# Patient Record
Sex: Male | Born: 1995 | Race: White | Hispanic: No | Marital: Single | Smoking: Current every day smoker
Health system: Southern US, Community
[De-identification: ages and names within clinical notes are randomized; demographics above are authoritative.]

---
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
## 2015-10-17 IMAGING — CT CT ABD-PELV W/ CM
2 of 4 series · 16 of 46 positions shown, 18 images · IV contrast (APPLIED)
Comparison: None.

CLINICAL DATA: Vomiting, diarrhea, lower abdominal pain starting
this morning

EXAM:
CT ABDOMEN AND PELVIS WITH CONTRAST
TECHNIQUE: Multidetector CT imaging of the abdomen and pelvis was performed
using the standard protocol following bolus administration of
intravenous contrast.
CONTRAST:  50mL OMNIPAQUE IOHEXOL 300 MG/ML SOLN, 100mL OMNIPAQUE
IOHEXOL 300 MG/ML SOLN

[Series 2: abd/pelvis 5.0 b31f · axial · 0.59mm/px · z∈[+695,+1095]mm · 13 of 88 slices shown, 15 images]
[im 4/88  soft-tissue]
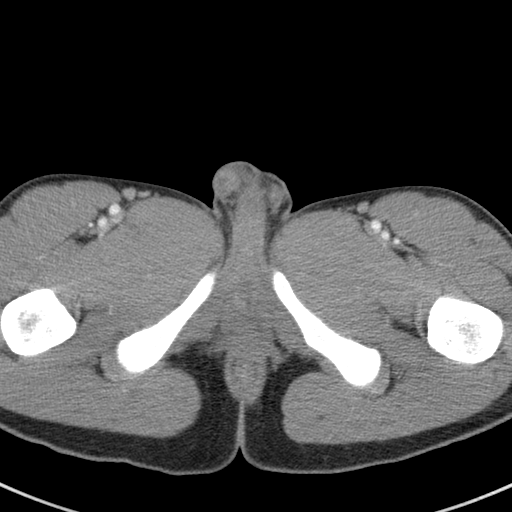
[im 4/88  bone]
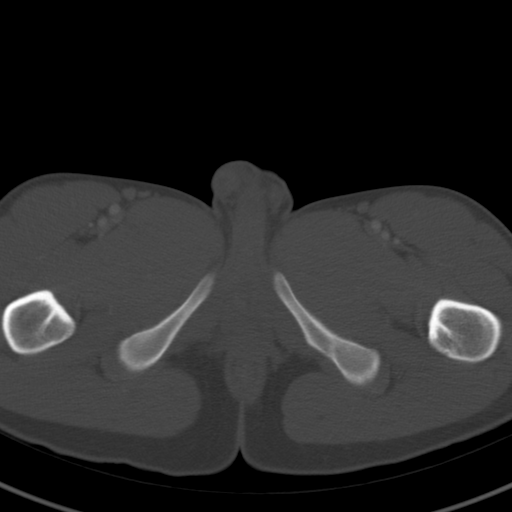
[im 11/88  soft-tissue]
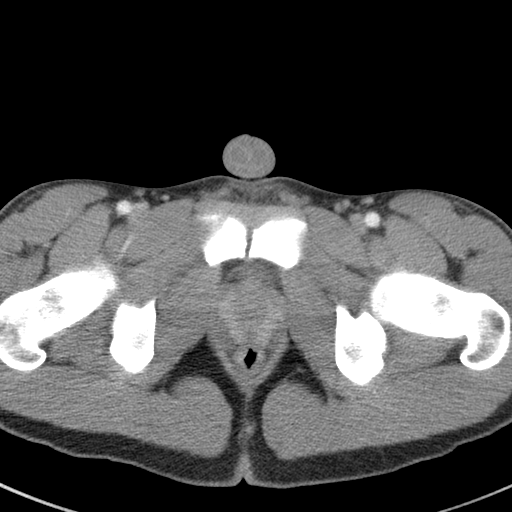
[im 18/88  soft-tissue]
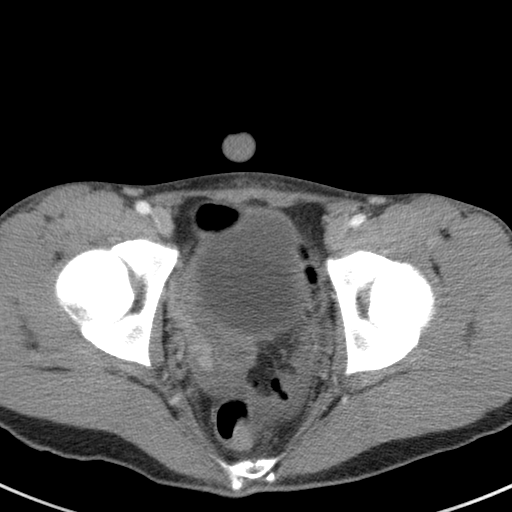
[im 25/88  soft-tissue]
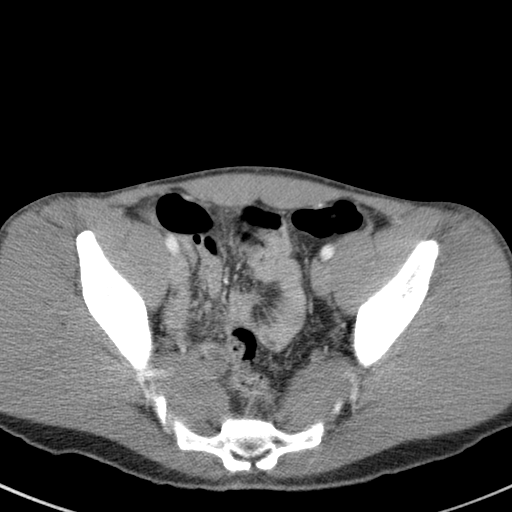
[im 32/88  soft-tissue]
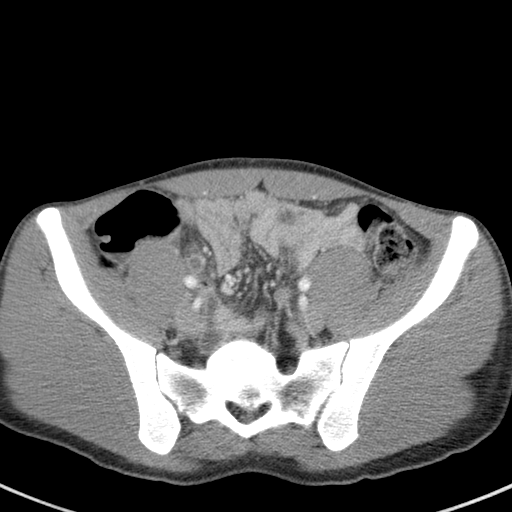
[im 39/88  soft-tissue]
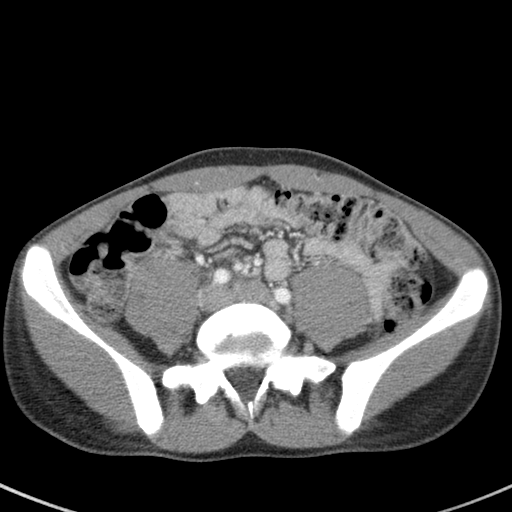
[im 46/88  soft-tissue]
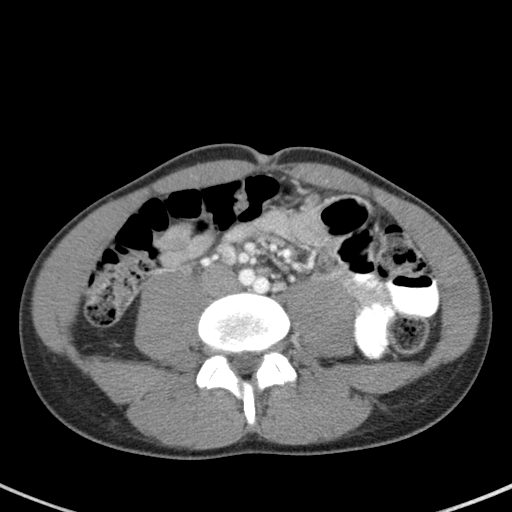
[im 49/88  soft-tissue]
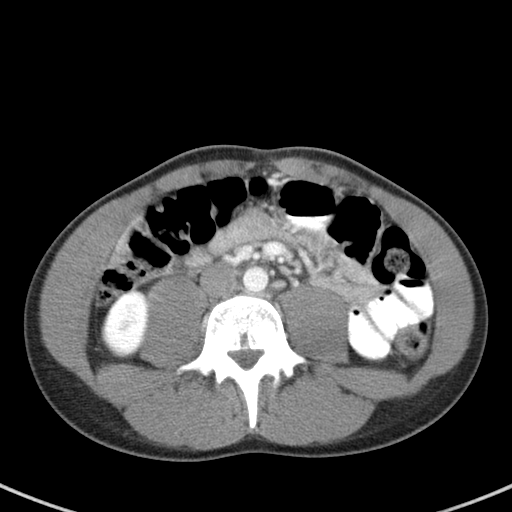
[im 56/88  soft-tissue]
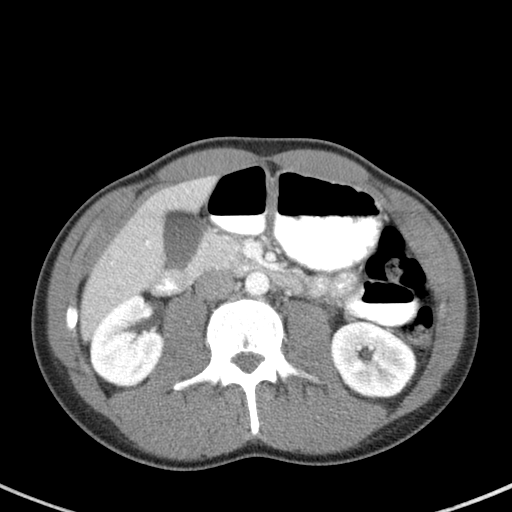
[im 56/88  bone]
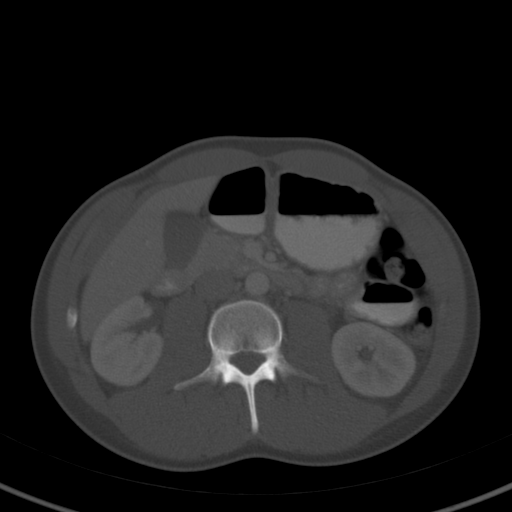
[im 63/88  soft-tissue]
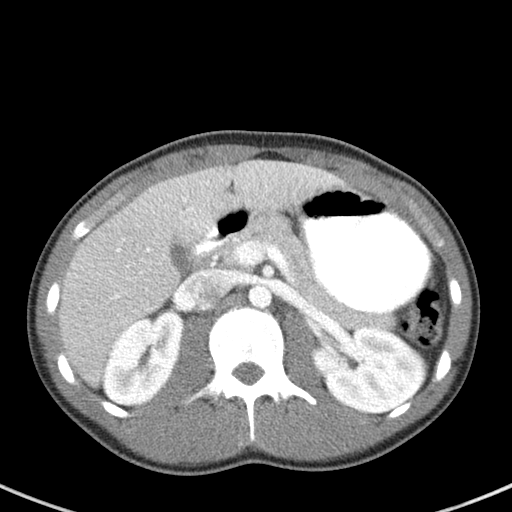
[im 70/88  soft-tissue]
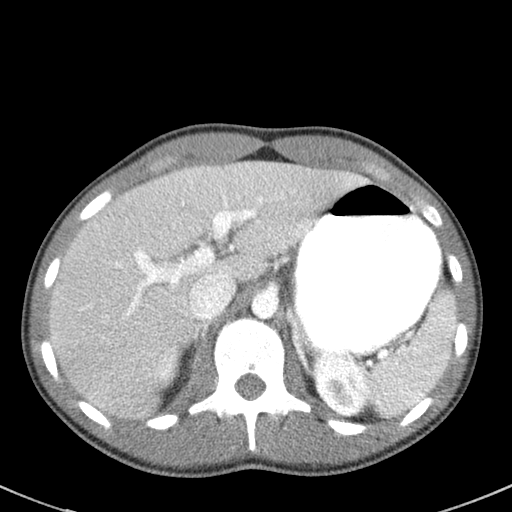
[im 77/88  soft-tissue]
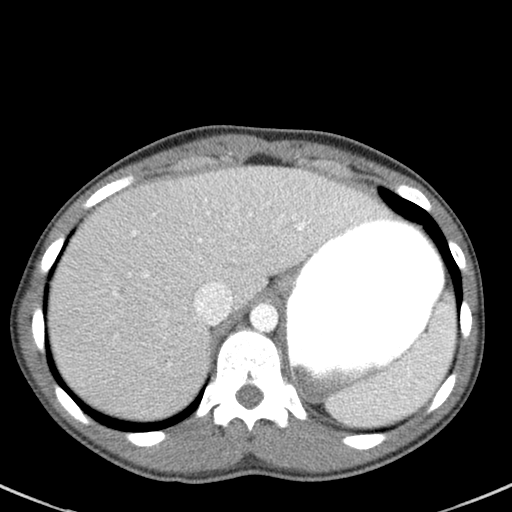
[im 84/88  soft-tissue]
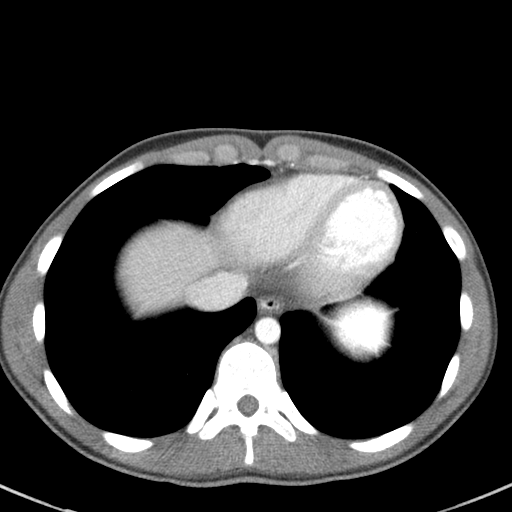

[Series 5: abd/pelvis 3.0 coronal · coronal · 0.65mm/px · 3 of 72 slices shown]
[im 24/72  soft-tissue]
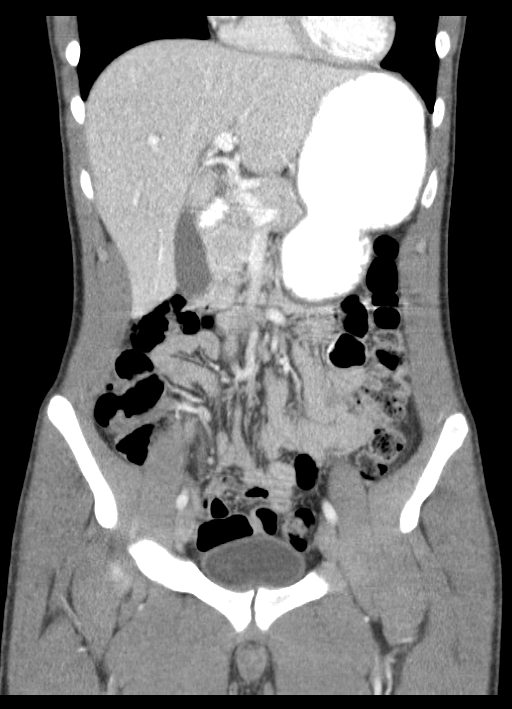
[im 32/72  soft-tissue]
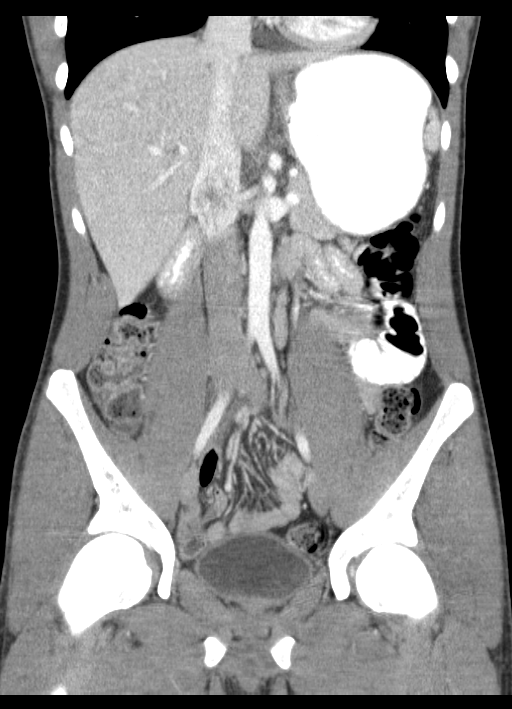
[im 40/72  soft-tissue]
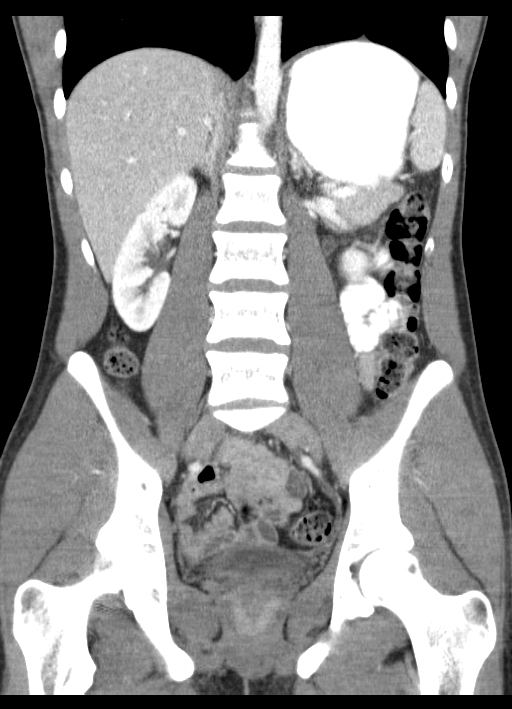

[16 of 46 positions shown; findings below may reference images not displayed]

FINDINGS: Lung bases are unremarkable.

Sagittal images of the spine are unremarkable.

Liver, spleen, pancreas and adrenals are unremarkable. No calcified
gallstones are noted within gallbladder. Abdominal aorta is
unremarkable. Kidneys are symmetrical in size and enhancement. No
hydronephrosis or hydroureter. Moderate colonic stool. There is
abnormal enhancement and thickening of the tip of the appendix.
There is mild stranding of surrounding fat. The appendix is best
visualized in axial image 59 measures 1.2 cm in diameter. Findings
are consistent with early acute appendicitis. Trace free fluid noted
posterior pelvis. The urinary bladder is unremarkable. Prostate
gland and seminal vesicles are unremarkable. No destructive bony
lesions are noted within pelvis. Abnormal tip of the appendix is
confirmed on coronal image 33.

No small bowel obstruction.  No free abdominal air.
IMPRESSION: 1. There is abnormal enhancement and thickening of the tip of the
appendix with stranding of surrounding fat. The appendix measures
1.2 cm in diameter. Findings are consistent with early tip
appendicitis.
2. No small bowel obstruction. Small amount of free fluid noted
posterior or pelvis.
3. Moderate colonic stool.
4. No hydronephrosis or hydroureter. These results were called by
telephone at the time of interpretation on 02/09/2014 at [DATE] to
Dr. AYAKO NEZEN , who verbally acknowledged these results.
